# Patient Record
Sex: Male | Born: 1955
Health system: Southern US, Community
[De-identification: ages and names within clinical notes are randomized; demographics above are authoritative.]

---

## 2007-05-08 ENCOUNTER — Encounter
Admission: RE | Admit: 2007-05-08 | Discharge: 2007-05-08 | Payer: Self-pay | Admitting: Physical Medicine and Rehabilitation

## 2009-08-21 IMAGING — CT CT L SPINE W/O CM
4 of 8 series · 13 of 33 positions shown, 15 images · IV contrast (agent unspecified)
Comparison: none

CLINICAL DATA: Diskography, L3-4 through L5-S1.
 LUMBAR SPINE CT WITHOUT CONTRAST:
TECHNIQUE: Multidetector CT imaging of the lumbar spine was performed.  Multiplanar CT image reconstructions were also generated.

[Series 2: l-spine helical · axial · 0.27mm/px · z∈[-242,-158]mm · 3 of 69 slices shown]
[im 18/69  bone]
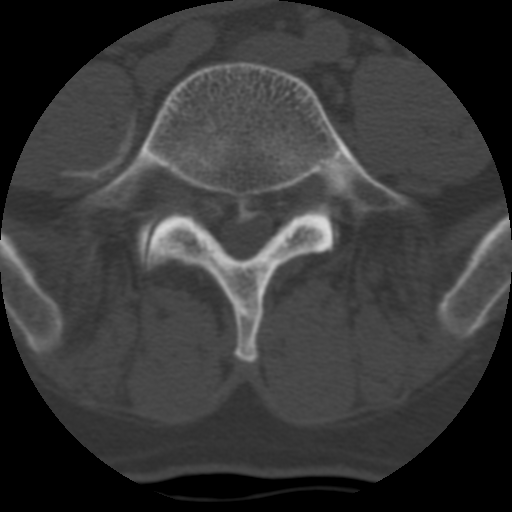
[im 35/69  bone]
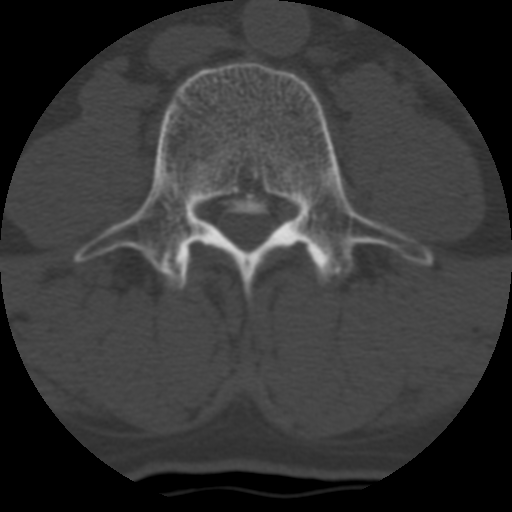
[im 52/69  bone]
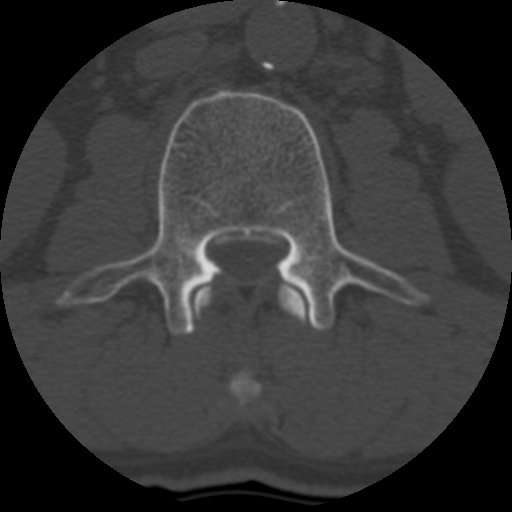

[Series 3: bone windows · axial · 0.27mm/px · z∈[-252,-150]mm · 4 of 69 slices shown, 5 images]
[im 14/69  soft-tissue]
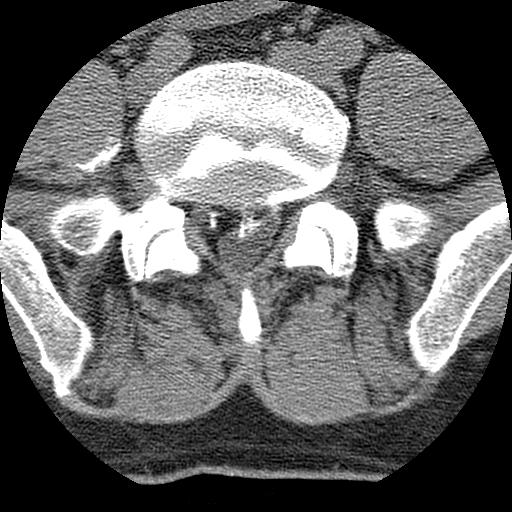
[im 14/69  bone]
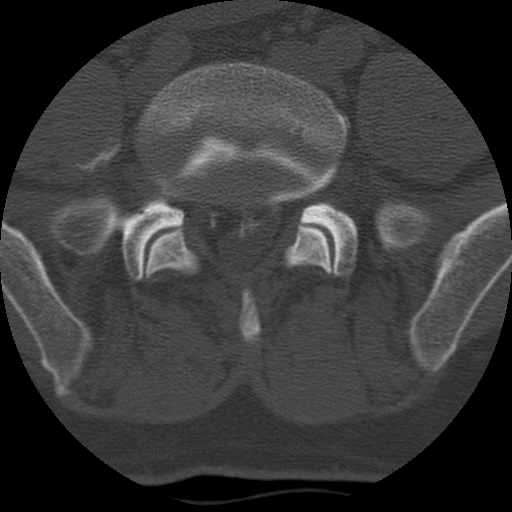
[im 28/69  bone]
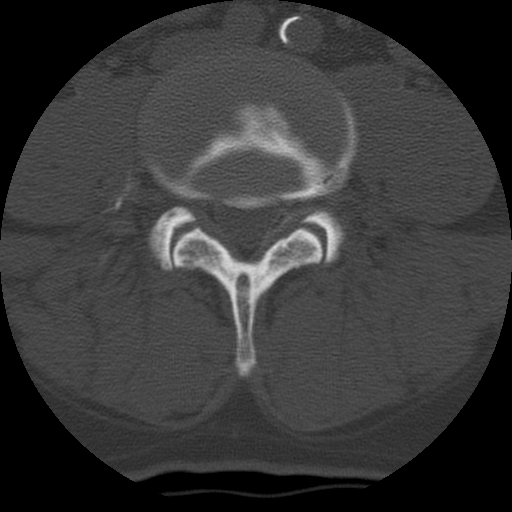
[im 41/69  bone]
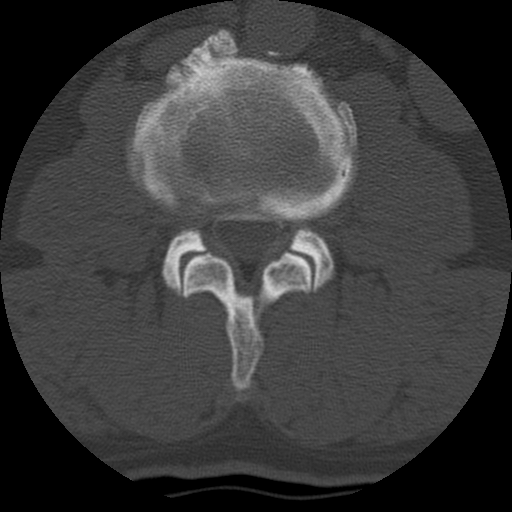
[im 55/69  bone]
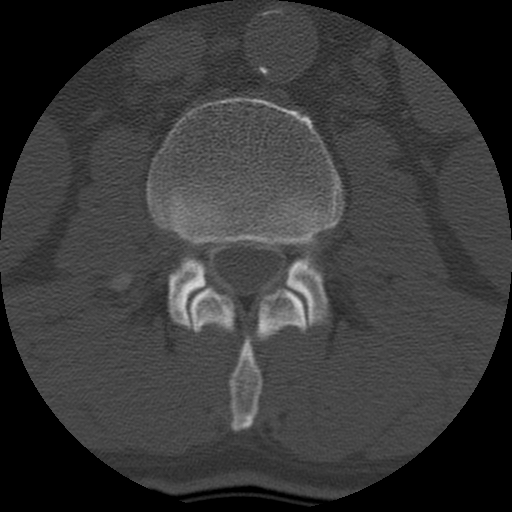

[Series 400: coronal l-spine · coronal · 0.34mm/px · 1 of 40 slices shown]
[im 20/40  bone]
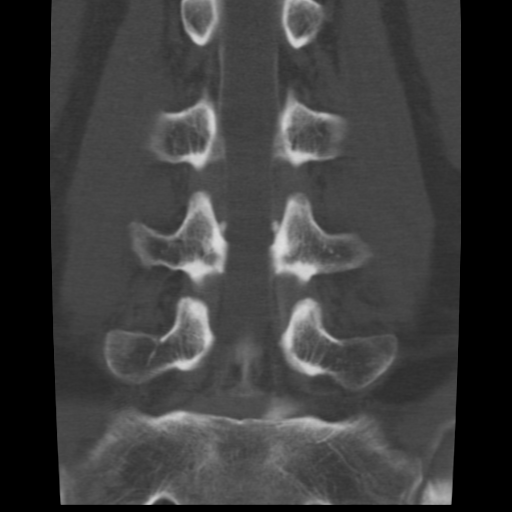

[Series 402: sagittal l-spine · sagittal · 0.34mm/px · 5 of 40 slices shown, 6 images]
[im 14/40  bone]
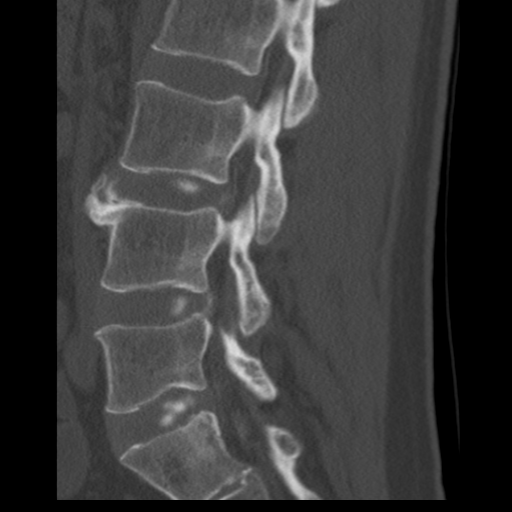
[im 17/40  bone]
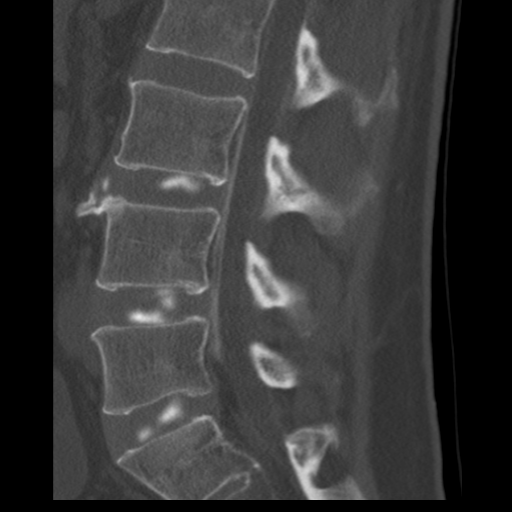
[im 20/40  soft-tissue]
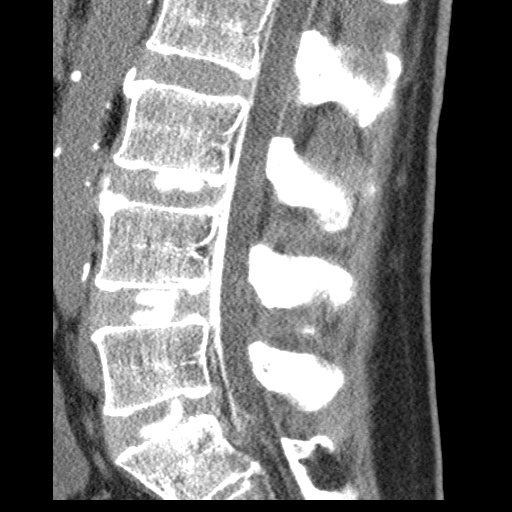
[im 20/40  bone]
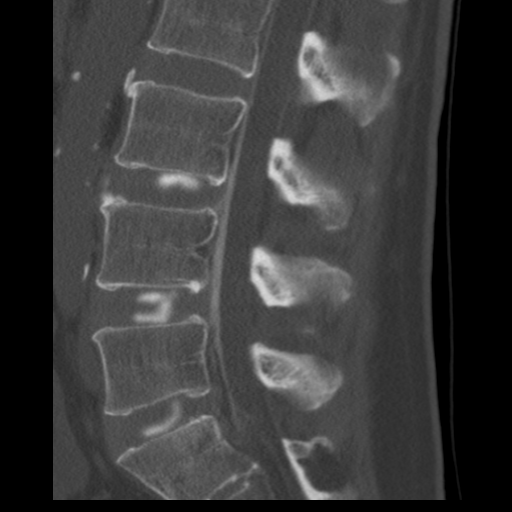
[im 23/40  bone]
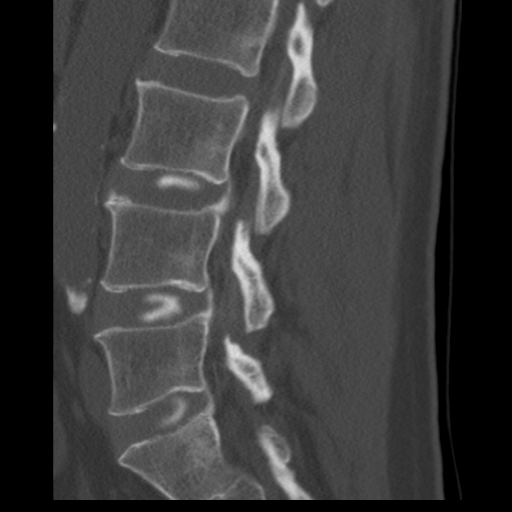
[im 27/40  bone]
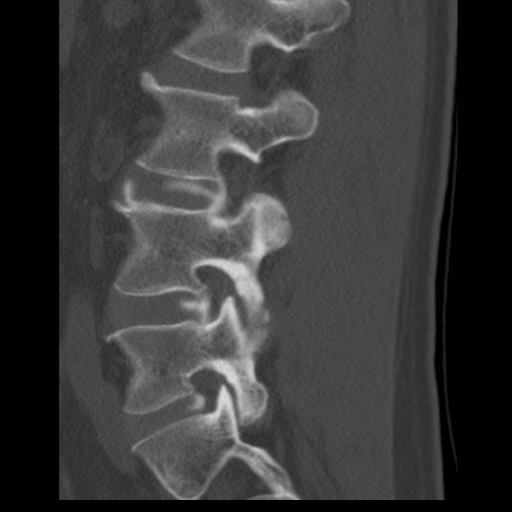

[13 of 33 positions shown; findings below may reference images not displayed]

FINDINGS: L3-4:  There is a normal central nuclear pattern.  There is an annular tear in the left posterolateral direction at 4 o'clock with contrast extending circumferentially within the annulus.  There is ventral epidural contrast diffusely but it is difficult to tell the exact spot that it is derived from.
 L4-5:  There is a normal central nuclear pattern.  There are bilateral posterolateral annular tears at both 4 o'clock and 8 o'clock.  Contrast extends around the annulus from 2 o'clock to 9 o'clock.  There is contrast in the ventral epidural space at this level.  
 L5-S1:  There is a normal central nuclear pattern.  There is bilateral posterolateral annular tearing at 4 o'clock and 8 o'clock.  There is ventral epidural contrast.  No compressive stenosis is seen.
 No focal osseous lesion or pronounced facet arthropathy.  There are anterior bridging osteophytes at L3-4.  There is atherosclerosis of the aorta but no aneurysm.  The L2-3 level was also covered by the scan though this was not studied at diskography.  The disk was unremarkable and the canal and foramina are widely patent.  Epidural contrast is present from the injections below.
IMPRESSION: 1.  L3-4:  Annular tearing at 4 o'clock.  Contrast dissects circumferentially around the annulus.  
 2.  L4-5:  Annular tearing at 4 o'clock and 8 o'clock.  Contrast extends around the annulus from 2 o'clock to 9 o'clock.
 3.  L5-S1:  Annular tearing at 4 o'clock and 8 o'clock.  
 4.   There is diffuse epidural contrast.  It is impossible to tell on this CT where this came from precisely.  I only have limited films from the diskogram procedure.

## 2016-02-02 DIAGNOSIS — R1903 Right lower quadrant abdominal swelling, mass and lump: Secondary | ICD-10-CM | POA: Insufficient documentation

## 2017-03-22 DIAGNOSIS — Z87898 Personal history of other specified conditions: Secondary | ICD-10-CM | POA: Diagnosis not present

## 2017-03-22 DIAGNOSIS — E782 Mixed hyperlipidemia: Secondary | ICD-10-CM | POA: Diagnosis not present

## 2017-03-22 DIAGNOSIS — E1165 Type 2 diabetes mellitus with hyperglycemia: Secondary | ICD-10-CM | POA: Diagnosis not present

## 2017-04-21 DIAGNOSIS — E1165 Type 2 diabetes mellitus with hyperglycemia: Secondary | ICD-10-CM | POA: Diagnosis not present

## 2017-04-21 DIAGNOSIS — Z23 Encounter for immunization: Secondary | ICD-10-CM | POA: Diagnosis not present

## 2017-08-18 DIAGNOSIS — E113512 Type 2 diabetes mellitus with proliferative diabetic retinopathy with macular edema, left eye: Secondary | ICD-10-CM | POA: Diagnosis not present

## 2017-08-24 DIAGNOSIS — E1165 Type 2 diabetes mellitus with hyperglycemia: Secondary | ICD-10-CM | POA: Diagnosis not present

## 2017-08-24 DIAGNOSIS — E782 Mixed hyperlipidemia: Secondary | ICD-10-CM | POA: Diagnosis not present

## 2017-09-01 DIAGNOSIS — E113411 Type 2 diabetes mellitus with severe nonproliferative diabetic retinopathy with macular edema, right eye: Secondary | ICD-10-CM | POA: Diagnosis not present

## 2017-09-01 DIAGNOSIS — E113512 Type 2 diabetes mellitus with proliferative diabetic retinopathy with macular edema, left eye: Secondary | ICD-10-CM | POA: Diagnosis not present

## 2017-09-07 DIAGNOSIS — E113512 Type 2 diabetes mellitus with proliferative diabetic retinopathy with macular edema, left eye: Secondary | ICD-10-CM | POA: Diagnosis not present

## 2017-10-13 DIAGNOSIS — E113512 Type 2 diabetes mellitus with proliferative diabetic retinopathy with macular edema, left eye: Secondary | ICD-10-CM | POA: Diagnosis not present

## 2017-10-13 DIAGNOSIS — E113411 Type 2 diabetes mellitus with severe nonproliferative diabetic retinopathy with macular edema, right eye: Secondary | ICD-10-CM | POA: Diagnosis not present

## 2017-11-17 DIAGNOSIS — E113512 Type 2 diabetes mellitus with proliferative diabetic retinopathy with macular edema, left eye: Secondary | ICD-10-CM | POA: Diagnosis not present

## 2017-11-17 DIAGNOSIS — E113411 Type 2 diabetes mellitus with severe nonproliferative diabetic retinopathy with macular edema, right eye: Secondary | ICD-10-CM | POA: Diagnosis not present

## 2017-12-06 DIAGNOSIS — E782 Mixed hyperlipidemia: Secondary | ICD-10-CM | POA: Diagnosis not present

## 2017-12-06 DIAGNOSIS — E1165 Type 2 diabetes mellitus with hyperglycemia: Secondary | ICD-10-CM | POA: Diagnosis not present

## 2017-12-06 DIAGNOSIS — Z1331 Encounter for screening for depression: Secondary | ICD-10-CM | POA: Diagnosis not present

## 2017-12-14 DIAGNOSIS — E875 Hyperkalemia: Secondary | ICD-10-CM | POA: Diagnosis not present

## 2017-12-29 DIAGNOSIS — E113513 Type 2 diabetes mellitus with proliferative diabetic retinopathy with macular edema, bilateral: Secondary | ICD-10-CM | POA: Diagnosis not present

## 2018-02-09 DIAGNOSIS — E113513 Type 2 diabetes mellitus with proliferative diabetic retinopathy with macular edema, bilateral: Secondary | ICD-10-CM | POA: Diagnosis not present

## 2018-03-30 DIAGNOSIS — E113512 Type 2 diabetes mellitus with proliferative diabetic retinopathy with macular edema, left eye: Secondary | ICD-10-CM | POA: Diagnosis not present

## 2018-03-30 DIAGNOSIS — E113411 Type 2 diabetes mellitus with severe nonproliferative diabetic retinopathy with macular edema, right eye: Secondary | ICD-10-CM | POA: Diagnosis not present

## 2018-08-01 DIAGNOSIS — E782 Mixed hyperlipidemia: Secondary | ICD-10-CM | POA: Diagnosis not present

## 2018-08-01 DIAGNOSIS — E1165 Type 2 diabetes mellitus with hyperglycemia: Secondary | ICD-10-CM | POA: Diagnosis not present

## 2018-08-01 DIAGNOSIS — Z2821 Immunization not carried out because of patient refusal: Secondary | ICD-10-CM | POA: Diagnosis not present

## 2018-08-10 DIAGNOSIS — E113411 Type 2 diabetes mellitus with severe nonproliferative diabetic retinopathy with macular edema, right eye: Secondary | ICD-10-CM | POA: Diagnosis not present

## 2018-08-10 DIAGNOSIS — E113512 Type 2 diabetes mellitus with proliferative diabetic retinopathy with macular edema, left eye: Secondary | ICD-10-CM | POA: Diagnosis not present

## 2018-08-24 DIAGNOSIS — E113513 Type 2 diabetes mellitus with proliferative diabetic retinopathy with macular edema, bilateral: Secondary | ICD-10-CM | POA: Diagnosis not present

## 2018-11-06 DIAGNOSIS — E1169 Type 2 diabetes mellitus with other specified complication: Secondary | ICD-10-CM | POA: Diagnosis not present

## 2018-11-06 DIAGNOSIS — E119 Type 2 diabetes mellitus without complications: Secondary | ICD-10-CM | POA: Diagnosis not present

## 2018-11-06 DIAGNOSIS — Z79899 Other long term (current) drug therapy: Secondary | ICD-10-CM | POA: Diagnosis not present

## 2018-11-14 DIAGNOSIS — E875 Hyperkalemia: Secondary | ICD-10-CM | POA: Diagnosis not present

## 2018-12-08 DIAGNOSIS — E113411 Type 2 diabetes mellitus with severe nonproliferative diabetic retinopathy with macular edema, right eye: Secondary | ICD-10-CM | POA: Diagnosis not present

## 2018-12-15 ENCOUNTER — Other Ambulatory Visit: Payer: Self-pay

## 2018-12-15 NOTE — Patient Outreach (Signed)
[facility] [facility]) Care Management  12/15/2018  Phillip Rosales [birth year] [id number]   Medication Adherence call to Mr. Phillip Rosales HIPPA Compliant Voice message left with a call back number. Phillip Rosales is showing past due on Rosuvastatin 10 mg under [place].   [place] Management Direct Dial [phone]  Fax [phone] [email or web address]

## 2019-01-08 ENCOUNTER — Other Ambulatory Visit: Payer: Self-pay

## 2019-01-08 NOTE — Patient Outreach (Signed)
[facility]) Care Management  01/08/2019  Phillip Rosales 16-Jun-1956 [id number]   Medication Adherence call to Phillip Rosales patient's telephone number is disconnected patient is past due under [place].   [place] Management Direct Dial [phone]  Fax [phone] [name]

## 2019-01-31 DIAGNOSIS — Z9181 History of falling: Secondary | ICD-10-CM | POA: Diagnosis not present

## 2019-01-31 DIAGNOSIS — Z0000 Encounter for general adult medical examination without abnormal findings: Secondary | ICD-10-CM | POA: Diagnosis not present

## 2019-01-31 DIAGNOSIS — E785 Hyperlipidemia, unspecified: Secondary | ICD-10-CM | POA: Diagnosis not present

## 2019-01-31 DIAGNOSIS — Z136 Encounter for screening for cardiovascular disorders: Secondary | ICD-10-CM | POA: Diagnosis not present

## 2019-03-14 DIAGNOSIS — E119 Type 2 diabetes mellitus without complications: Secondary | ICD-10-CM | POA: Diagnosis not present

## 2019-03-14 DIAGNOSIS — E785 Hyperlipidemia, unspecified: Secondary | ICD-10-CM | POA: Diagnosis not present

## 2019-03-14 DIAGNOSIS — E1169 Type 2 diabetes mellitus with other specified complication: Secondary | ICD-10-CM | POA: Diagnosis not present

## 2019-04-24 DIAGNOSIS — Z6824 Body mass index (BMI) 24.0-24.9, adult: Secondary | ICD-10-CM | POA: Diagnosis not present

## 2019-04-24 DIAGNOSIS — H539 Unspecified visual disturbance: Secondary | ICD-10-CM | POA: Diagnosis not present

## 2019-05-01 DIAGNOSIS — E113512 Type 2 diabetes mellitus with proliferative diabetic retinopathy with macular edema, left eye: Secondary | ICD-10-CM | POA: Diagnosis not present

## 2019-05-01 DIAGNOSIS — E113411 Type 2 diabetes mellitus with severe nonproliferative diabetic retinopathy with macular edema, right eye: Secondary | ICD-10-CM | POA: Diagnosis not present

## 2019-08-09 DIAGNOSIS — E1169 Type 2 diabetes mellitus with other specified complication: Secondary | ICD-10-CM | POA: Diagnosis not present

## 2019-08-09 DIAGNOSIS — Z6824 Body mass index (BMI) 24.0-24.9, adult: Secondary | ICD-10-CM | POA: Diagnosis not present

## 2019-08-09 DIAGNOSIS — E1165 Type 2 diabetes mellitus with hyperglycemia: Secondary | ICD-10-CM | POA: Diagnosis not present

## 2019-08-09 DIAGNOSIS — E785 Hyperlipidemia, unspecified: Secondary | ICD-10-CM | POA: Diagnosis not present

## 2019-08-09 DIAGNOSIS — E559 Vitamin D deficiency, unspecified: Secondary | ICD-10-CM | POA: Diagnosis not present

## 2019-12-13 DIAGNOSIS — E113411 Type 2 diabetes mellitus with severe nonproliferative diabetic retinopathy with macular edema, right eye: Secondary | ICD-10-CM | POA: Diagnosis not present

## 2019-12-14 DIAGNOSIS — E559 Vitamin D deficiency, unspecified: Secondary | ICD-10-CM | POA: Diagnosis not present

## 2019-12-14 DIAGNOSIS — E785 Hyperlipidemia, unspecified: Secondary | ICD-10-CM | POA: Diagnosis not present

## 2019-12-14 DIAGNOSIS — E1165 Type 2 diabetes mellitus with hyperglycemia: Secondary | ICD-10-CM | POA: Diagnosis not present

## 2019-12-14 DIAGNOSIS — E1169 Type 2 diabetes mellitus with other specified complication: Secondary | ICD-10-CM | POA: Diagnosis not present

## 2019-12-14 DIAGNOSIS — Z6824 Body mass index (BMI) 24.0-24.9, adult: Secondary | ICD-10-CM | POA: Diagnosis not present

## 2020-01-09 DIAGNOSIS — Z6824 Body mass index (BMI) 24.0-24.9, adult: Secondary | ICD-10-CM | POA: Diagnosis not present

## 2020-01-09 DIAGNOSIS — E1169 Type 2 diabetes mellitus with other specified complication: Secondary | ICD-10-CM | POA: Diagnosis not present

## 2020-01-09 DIAGNOSIS — E785 Hyperlipidemia, unspecified: Secondary | ICD-10-CM | POA: Diagnosis not present

## 2020-01-09 DIAGNOSIS — E875 Hyperkalemia: Secondary | ICD-10-CM | POA: Diagnosis not present

## 2020-01-09 DIAGNOSIS — E1165 Type 2 diabetes mellitus with hyperglycemia: Secondary | ICD-10-CM | POA: Diagnosis not present

## 2020-01-24 DIAGNOSIS — E113211 Type 2 diabetes mellitus with mild nonproliferative diabetic retinopathy with macular edema, right eye: Secondary | ICD-10-CM | POA: Diagnosis not present

## 2020-01-30 DIAGNOSIS — Z23 Encounter for immunization: Secondary | ICD-10-CM | POA: Diagnosis not present

## 2020-01-30 DIAGNOSIS — L97509 Non-pressure chronic ulcer of other part of unspecified foot with unspecified severity: Secondary | ICD-10-CM | POA: Diagnosis not present

## 2020-01-30 DIAGNOSIS — E11621 Type 2 diabetes mellitus with foot ulcer: Secondary | ICD-10-CM | POA: Diagnosis not present

## 2020-01-30 DIAGNOSIS — Z6823 Body mass index (BMI) 23.0-23.9, adult: Secondary | ICD-10-CM | POA: Diagnosis not present

## 2020-01-30 DIAGNOSIS — L0390 Cellulitis, unspecified: Secondary | ICD-10-CM | POA: Diagnosis not present

## 2020-01-31 ENCOUNTER — Encounter: Payer: Self-pay | Admitting: Sports Medicine

## 2020-01-31 ENCOUNTER — Ambulatory Visit: Payer: Medicare Other | Admitting: Sports Medicine

## 2020-01-31 ENCOUNTER — Other Ambulatory Visit: Payer: Self-pay

## 2020-01-31 ENCOUNTER — Ambulatory Visit (INDEPENDENT_AMBULATORY_CARE_PROVIDER_SITE_OTHER): Payer: Medicare Other

## 2020-01-31 DIAGNOSIS — L97511 Non-pressure chronic ulcer of other part of right foot limited to breakdown of skin: Secondary | ICD-10-CM

## 2020-01-31 DIAGNOSIS — Z823 Family history of stroke: Secondary | ICD-10-CM | POA: Diagnosis not present

## 2020-01-31 DIAGNOSIS — M19071 Primary osteoarthritis, right ankle and foot: Secondary | ICD-10-CM | POA: Diagnosis not present

## 2020-01-31 DIAGNOSIS — L02611 Cutaneous abscess of right foot: Secondary | ICD-10-CM | POA: Diagnosis not present

## 2020-01-31 DIAGNOSIS — Z20822 Contact with and (suspected) exposure to covid-19: Secondary | ICD-10-CM | POA: Diagnosis not present

## 2020-01-31 DIAGNOSIS — L03031 Cellulitis of right toe: Secondary | ICD-10-CM | POA: Diagnosis not present

## 2020-01-31 DIAGNOSIS — E1142 Type 2 diabetes mellitus with diabetic polyneuropathy: Secondary | ICD-10-CM | POA: Diagnosis not present

## 2020-01-31 DIAGNOSIS — L97516 Non-pressure chronic ulcer of other part of right foot with bone involvement without evidence of necrosis: Secondary | ICD-10-CM | POA: Diagnosis not present

## 2020-01-31 DIAGNOSIS — Z89421 Acquired absence of other right toe(s): Secondary | ICD-10-CM | POA: Diagnosis not present

## 2020-01-31 DIAGNOSIS — L97509 Non-pressure chronic ulcer of other part of unspecified foot with unspecified severity: Secondary | ICD-10-CM | POA: Diagnosis not present

## 2020-01-31 DIAGNOSIS — B0229 Other postherpetic nervous system involvement: Secondary | ICD-10-CM | POA: Diagnosis not present

## 2020-01-31 DIAGNOSIS — Z8249 Family history of ischemic heart disease and other diseases of the circulatory system: Secondary | ICD-10-CM | POA: Diagnosis not present

## 2020-01-31 DIAGNOSIS — Z9889 Other specified postprocedural states: Secondary | ICD-10-CM | POA: Diagnosis not present

## 2020-01-31 DIAGNOSIS — Z809 Family history of malignant neoplasm, unspecified: Secondary | ICD-10-CM | POA: Diagnosis not present

## 2020-01-31 DIAGNOSIS — A4901 Methicillin susceptible Staphylococcus aureus infection, unspecified site: Secondary | ICD-10-CM | POA: Diagnosis not present

## 2020-01-31 DIAGNOSIS — E11621 Type 2 diabetes mellitus with foot ulcer: Secondary | ICD-10-CM | POA: Diagnosis not present

## 2020-01-31 DIAGNOSIS — I70293 Other atherosclerosis of native arteries of extremities, bilateral legs: Secondary | ICD-10-CM | POA: Diagnosis not present

## 2020-01-31 DIAGNOSIS — Z87891 Personal history of nicotine dependence: Secondary | ICD-10-CM | POA: Diagnosis not present

## 2020-01-31 DIAGNOSIS — E119 Type 2 diabetes mellitus without complications: Secondary | ICD-10-CM | POA: Diagnosis not present

## 2020-01-31 DIAGNOSIS — E1169 Type 2 diabetes mellitus with other specified complication: Secondary | ICD-10-CM | POA: Diagnosis not present

## 2020-01-31 DIAGNOSIS — L089 Local infection of the skin and subcutaneous tissue, unspecified: Secondary | ICD-10-CM | POA: Diagnosis not present

## 2020-01-31 DIAGNOSIS — M86171 Other acute osteomyelitis, right ankle and foot: Secondary | ICD-10-CM | POA: Diagnosis not present

## 2020-01-31 DIAGNOSIS — M7989 Other specified soft tissue disorders: Secondary | ICD-10-CM | POA: Diagnosis not present

## 2020-01-31 DIAGNOSIS — E11319 Type 2 diabetes mellitus with unspecified diabetic retinopathy without macular edema: Secondary | ICD-10-CM | POA: Diagnosis not present

## 2020-01-31 DIAGNOSIS — Z825 Family history of asthma and other chronic lower respiratory diseases: Secondary | ICD-10-CM | POA: Diagnosis not present

## 2020-01-31 DIAGNOSIS — E785 Hyperlipidemia, unspecified: Secondary | ICD-10-CM | POA: Diagnosis not present

## 2020-01-31 DIAGNOSIS — E7800 Pure hypercholesterolemia, unspecified: Secondary | ICD-10-CM | POA: Diagnosis not present

## 2020-01-31 DIAGNOSIS — S98131A Complete traumatic amputation of one right lesser toe, initial encounter: Secondary | ICD-10-CM | POA: Diagnosis not present

## 2020-01-31 NOTE — Progress Notes (Signed)
Subjective: Phillip Rosales is a 64 y.o. male patient seen in office for evaluation of ulceration of the right fourth toe. Patient has a history of diabetes and a blood glucose level 82 and last A1c 6.2 was last seen by PCP Y[facility] 3 to 4 weeks ago and was put on antibiotics and also gave a shot of antibiotics and has a MRI arranged for 4 PM today.  Patient reports that the wound on the right foot was bad but slowly it seems like it is getting better he thinks that it started after pedaling an exercise bike and the toe got rubbed.  Reports that there is no drainage but some white skin in between the toes denies nausea vomiting fever chills or any other constitutional symptoms at this time.  Review of systems noncontributory.  Patient Active Problem List   Diagnosis Date Noted   RLQ abdominal mass 02/02/2016   Current Outpatient Medications on File Prior to Visit  Medication Sig Dispense Refill   glucose blood (ONETOUCH VERIO) test strip      clindamycin (CLEOCIN) 300 MG capsule Take 300 mg by mouth every 6 (six) hours.     glyBURIDE (DIABETA) 5 MG tablet Take by mouth.     metFORMIN (GLUCOPHAGE) 500 MG tablet Take 500 mg by mouth 2 (two) times daily.     rosuvastatin (CRESTOR) 40 MG tablet Take 40 mg by mouth every morning.     No current facility-administered medications on file prior to visit.   Not on File  No results found for this or any previous visit (from the past 2160 hour(s)).  Objective: There were no vitals filed for this visit.  General: Patient is awake, alert, oriented x 3 and in no acute distress.  Dermatology: Skin is warm and dry bilateral with a partial thickness ulceration present  Lateral right fourth toe ulceration measures 0.5 cm x 0.5 cm x 0.2 cm. There is a  Macerated border with a macerated base. The ulceration is close to bone with significant clear drainage and cellulitis to the level of the midfoot and edema with minimal warmth.   Vascular: Dorsalis  Pedis pulse = 1/4 Bilateral,  Posterior Tibial pulse = 1/4 Bilateral,  Capillary Fill Time < 5 seconds  Neurologic: Protective sensation diminished bilateral.  Musculosketal: There is no pain to palpation to ulcerated area, mild hammertoe contracture.  Xrays, right foot- No bony destruction suggestive of osteomyelitis. No gas in soft tissues.   No results for input(s): GRAMSTAIN, LABORGA in the last 8760 hours.  Assessment and Plan:  Problem List Items Addressed This Visit    None    Visit Diagnoses    Ulcer of right foot, limited to breakdown of skin ([place])    -  Primary   Relevant Orders   DG Foot Complete Right   Cellulitis and abscess of toe of right foot       Diabetic polyneuropathy associated with type 2 diabetes mellitus (HCC)       Relevant Medications   glyBURIDE (DIABETA) 5 MG tablet   metFORMIN (GLUCOPHAGE) 500 MG tablet   rosuvastatin (CRESTOR) 40 MG tablet       -Examined patient and discussed the progression of the wound and treatment alternatives. -Xrays reviewed -Cleanse ulceration and apply Betadine advised patient to do the same daily and may leave open to air -Dispensed postoperative shoe and advised patient to wear this shoe daily to prevent rubbing of toe -Patient to go to for MRI today at 4 PM  for further evaluation rule out osteomyelitis or abscess -Continue with oral antibiotics as given by PCP - Advised patient to go to the ER or return to office if the wound worsens or if constitutional symptoms are present. -Patient to return to office in 1 week for follow up care and evaluation or sooner if problems arise.  Landis Martins, DPM

## 2020-02-01 ENCOUNTER — Telehealth: Payer: Self-pay

## 2020-02-01 DIAGNOSIS — M7989 Other specified soft tissue disorders: Secondary | ICD-10-CM | POA: Diagnosis not present

## 2020-02-01 DIAGNOSIS — E11621 Type 2 diabetes mellitus with foot ulcer: Secondary | ICD-10-CM | POA: Diagnosis not present

## 2020-02-01 DIAGNOSIS — I70293 Other atherosclerosis of native arteries of extremities, bilateral legs: Secondary | ICD-10-CM | POA: Diagnosis not present

## 2020-02-01 DIAGNOSIS — M86171 Other acute osteomyelitis, right ankle and foot: Secondary | ICD-10-CM | POA: Diagnosis not present

## 2020-02-01 DIAGNOSIS — M19071 Primary osteoarthritis, right ankle and foot: Secondary | ICD-10-CM | POA: Diagnosis not present

## 2020-02-01 DIAGNOSIS — L97516 Non-pressure chronic ulcer of other part of right foot with bone involvement without evidence of necrosis: Secondary | ICD-10-CM | POA: Diagnosis not present

## 2020-02-01 NOTE — Telephone Encounter (Signed)
Contacted Pt and advised him of Dr. Leeanne Rio wound results and suggestions sent 01/31/20 at 10:23PM.  Pt stated Dr. Lorin Mercy had called him last night as well and suggested for him to go to ER early in the morning, pt states he is already at the ER not knowing what Dr. Lorin Mercy plans on doing or performing.

## 2020-02-01 NOTE — Telephone Encounter (Signed)
-----   Message from Landis Martins, [place] sent at 01/31/2020 10:23 PM EDT ----- Regarding: MRI Results Please let patient know that his MRI from yesterday shows that he has some early bone infection in the right fourth toe.  At this time I recommend for him to continue with taking his antibiotics and continue with the Betadine medication in between the toes.  When patient comes to the office on next week we can further discuss treatment options which may include continuing with antibiotics versus surgery. Thanks Dr. Cannon Kettle

## 2020-02-02 DIAGNOSIS — M86171 Other acute osteomyelitis, right ankle and foot: Secondary | ICD-10-CM | POA: Diagnosis not present

## 2020-02-08 ENCOUNTER — Ambulatory Visit: Payer: Medicare Other | Admitting: Sports Medicine

## 2020-02-12 DIAGNOSIS — M86171 Other acute osteomyelitis, right ankle and foot: Secondary | ICD-10-CM | POA: Diagnosis not present

## 2020-02-12 DIAGNOSIS — Z89421 Acquired absence of other right toe(s): Secondary | ICD-10-CM | POA: Diagnosis not present

## 2020-02-19 DIAGNOSIS — Z89421 Acquired absence of other right toe(s): Secondary | ICD-10-CM | POA: Diagnosis not present

## 2020-02-19 DIAGNOSIS — M86171 Other acute osteomyelitis, right ankle and foot: Secondary | ICD-10-CM | POA: Diagnosis not present

## 2020-03-04 DIAGNOSIS — Z89421 Acquired absence of other right toe(s): Secondary | ICD-10-CM | POA: Diagnosis not present

## 2020-03-06 DIAGNOSIS — E113211 Type 2 diabetes mellitus with mild nonproliferative diabetic retinopathy with macular edema, right eye: Secondary | ICD-10-CM | POA: Diagnosis not present

## 2020-03-21 DIAGNOSIS — Z01818 Encounter for other preprocedural examination: Secondary | ICD-10-CM | POA: Diagnosis not present

## 2020-03-21 DIAGNOSIS — E119 Type 2 diabetes mellitus without complications: Secondary | ICD-10-CM | POA: Diagnosis not present

## 2020-03-21 DIAGNOSIS — H2512 Age-related nuclear cataract, left eye: Secondary | ICD-10-CM | POA: Diagnosis not present

## 2020-03-21 DIAGNOSIS — E113512 Type 2 diabetes mellitus with proliferative diabetic retinopathy with macular edema, left eye: Secondary | ICD-10-CM | POA: Diagnosis not present

## 2020-04-15 DIAGNOSIS — H2512 Age-related nuclear cataract, left eye: Secondary | ICD-10-CM | POA: Diagnosis not present

## 2020-04-15 DIAGNOSIS — Z7984 Long term (current) use of oral hypoglycemic drugs: Secondary | ICD-10-CM | POA: Diagnosis not present

## 2020-04-15 DIAGNOSIS — E11311 Type 2 diabetes mellitus with unspecified diabetic retinopathy with macular edema: Secondary | ICD-10-CM | POA: Diagnosis not present

## 2020-04-15 DIAGNOSIS — E7800 Pure hypercholesterolemia, unspecified: Secondary | ICD-10-CM | POA: Diagnosis not present

## 2020-04-15 DIAGNOSIS — H259 Unspecified age-related cataract: Secondary | ICD-10-CM | POA: Diagnosis not present

## 2020-04-15 DIAGNOSIS — E1136 Type 2 diabetes mellitus with diabetic cataract: Secondary | ICD-10-CM | POA: Diagnosis not present

## 2020-05-05 DIAGNOSIS — E1169 Type 2 diabetes mellitus with other specified complication: Secondary | ICD-10-CM | POA: Diagnosis not present

## 2020-05-05 DIAGNOSIS — R030 Elevated blood-pressure reading, without diagnosis of hypertension: Secondary | ICD-10-CM | POA: Diagnosis not present

## 2020-05-05 DIAGNOSIS — E785 Hyperlipidemia, unspecified: Secondary | ICD-10-CM | POA: Diagnosis not present

## 2020-05-05 DIAGNOSIS — E1149 Type 2 diabetes mellitus with other diabetic neurological complication: Secondary | ICD-10-CM | POA: Diagnosis not present

## 2020-07-10 DIAGNOSIS — E113211 Type 2 diabetes mellitus with mild nonproliferative diabetic retinopathy with macular edema, right eye: Secondary | ICD-10-CM | POA: Diagnosis not present

## 2020-07-10 DIAGNOSIS — E113512 Type 2 diabetes mellitus with proliferative diabetic retinopathy with macular edema, left eye: Secondary | ICD-10-CM | POA: Diagnosis not present

## 2020-08-07 DIAGNOSIS — E785 Hyperlipidemia, unspecified: Secondary | ICD-10-CM | POA: Diagnosis not present

## 2020-08-07 DIAGNOSIS — Z6824 Body mass index (BMI) 24.0-24.9, adult: Secondary | ICD-10-CM | POA: Diagnosis not present

## 2020-08-07 DIAGNOSIS — S98131A Complete traumatic amputation of one right lesser toe, initial encounter: Secondary | ICD-10-CM | POA: Diagnosis not present

## 2020-08-07 DIAGNOSIS — Z1211 Encounter for screening for malignant neoplasm of colon: Secondary | ICD-10-CM | POA: Diagnosis not present

## 2020-08-07 DIAGNOSIS — Z20822 Contact with and (suspected) exposure to covid-19: Secondary | ICD-10-CM | POA: Diagnosis not present

## 2020-08-07 DIAGNOSIS — E1149 Type 2 diabetes mellitus with other diabetic neurological complication: Secondary | ICD-10-CM | POA: Diagnosis not present

## 2020-08-07 DIAGNOSIS — Z8739 Personal history of other diseases of the musculoskeletal system and connective tissue: Secondary | ICD-10-CM | POA: Diagnosis not present

## 2020-08-07 DIAGNOSIS — E1169 Type 2 diabetes mellitus with other specified complication: Secondary | ICD-10-CM | POA: Diagnosis not present

## 2020-08-07 DIAGNOSIS — E559 Vitamin D deficiency, unspecified: Secondary | ICD-10-CM | POA: Diagnosis not present

## 2020-08-07 DIAGNOSIS — R030 Elevated blood-pressure reading, without diagnosis of hypertension: Secondary | ICD-10-CM | POA: Diagnosis not present

## 2020-08-19 DIAGNOSIS — Z9181 History of falling: Secondary | ICD-10-CM | POA: Diagnosis not present

## 2020-08-19 DIAGNOSIS — Z0000 Encounter for general adult medical examination without abnormal findings: Secondary | ICD-10-CM | POA: Diagnosis not present

## 2020-08-19 DIAGNOSIS — E785 Hyperlipidemia, unspecified: Secondary | ICD-10-CM | POA: Diagnosis not present

## 2020-08-21 DIAGNOSIS — E875 Hyperkalemia: Secondary | ICD-10-CM | POA: Diagnosis not present

## 2020-09-02 DIAGNOSIS — Z89421 Acquired absence of other right toe(s): Secondary | ICD-10-CM | POA: Diagnosis not present

## 2020-09-02 DIAGNOSIS — E119 Type 2 diabetes mellitus without complications: Secondary | ICD-10-CM | POA: Diagnosis not present

## 2020-10-09 DIAGNOSIS — E113213 Type 2 diabetes mellitus with mild nonproliferative diabetic retinopathy with macular edema, bilateral: Secondary | ICD-10-CM | POA: Diagnosis not present

## 2020-12-18 DIAGNOSIS — Z1211 Encounter for screening for malignant neoplasm of colon: Secondary | ICD-10-CM | POA: Diagnosis not present

## 2020-12-18 DIAGNOSIS — E785 Hyperlipidemia, unspecified: Secondary | ICD-10-CM | POA: Diagnosis not present

## 2020-12-18 DIAGNOSIS — E1169 Type 2 diabetes mellitus with other specified complication: Secondary | ICD-10-CM | POA: Diagnosis not present

## 2020-12-18 DIAGNOSIS — Z8739 Personal history of other diseases of the musculoskeletal system and connective tissue: Secondary | ICD-10-CM | POA: Diagnosis not present

## 2020-12-18 DIAGNOSIS — E1149 Type 2 diabetes mellitus with other diabetic neurological complication: Secondary | ICD-10-CM | POA: Diagnosis not present

## 2020-12-18 DIAGNOSIS — E559 Vitamin D deficiency, unspecified: Secondary | ICD-10-CM | POA: Diagnosis not present

## 2020-12-18 DIAGNOSIS — S98131A Complete traumatic amputation of one right lesser toe, initial encounter: Secondary | ICD-10-CM | POA: Diagnosis not present

## 2021-03-05 DIAGNOSIS — Z89421 Acquired absence of other right toe(s): Secondary | ICD-10-CM | POA: Diagnosis not present

## 2021-03-05 DIAGNOSIS — E119 Type 2 diabetes mellitus without complications: Secondary | ICD-10-CM | POA: Diagnosis not present

## 2021-04-16 DIAGNOSIS — L821 Other seborrheic keratosis: Secondary | ICD-10-CM | POA: Diagnosis not present

## 2021-04-16 DIAGNOSIS — D225 Melanocytic nevi of trunk: Secondary | ICD-10-CM | POA: Diagnosis not present

## 2021-04-16 DIAGNOSIS — L578 Other skin changes due to chronic exposure to nonionizing radiation: Secondary | ICD-10-CM | POA: Diagnosis not present

## 2021-04-16 DIAGNOSIS — C44319 Basal cell carcinoma of skin of other parts of face: Secondary | ICD-10-CM | POA: Diagnosis not present

## 2021-04-23 DIAGNOSIS — E559 Vitamin D deficiency, unspecified: Secondary | ICD-10-CM | POA: Diagnosis not present

## 2021-04-23 DIAGNOSIS — E785 Hyperlipidemia, unspecified: Secondary | ICD-10-CM | POA: Diagnosis not present

## 2021-04-23 DIAGNOSIS — E1169 Type 2 diabetes mellitus with other specified complication: Secondary | ICD-10-CM | POA: Diagnosis not present

## 2021-04-23 DIAGNOSIS — E1149 Type 2 diabetes mellitus with other diabetic neurological complication: Secondary | ICD-10-CM | POA: Diagnosis not present

## 2021-04-23 DIAGNOSIS — S98131A Complete traumatic amputation of one right lesser toe, initial encounter: Secondary | ICD-10-CM | POA: Diagnosis not present

## 2021-04-23 DIAGNOSIS — R030 Elevated blood-pressure reading, without diagnosis of hypertension: Secondary | ICD-10-CM | POA: Diagnosis not present

## 2021-04-23 DIAGNOSIS — Z8739 Personal history of other diseases of the musculoskeletal system and connective tissue: Secondary | ICD-10-CM | POA: Diagnosis not present

## 2021-08-26 DIAGNOSIS — R030 Elevated blood-pressure reading, without diagnosis of hypertension: Secondary | ICD-10-CM | POA: Diagnosis not present

## 2021-08-26 DIAGNOSIS — Z9181 History of falling: Secondary | ICD-10-CM | POA: Diagnosis not present

## 2021-08-26 DIAGNOSIS — E559 Vitamin D deficiency, unspecified: Secondary | ICD-10-CM | POA: Diagnosis not present

## 2021-08-26 DIAGNOSIS — S98131A Complete traumatic amputation of one right lesser toe, initial encounter: Secondary | ICD-10-CM | POA: Diagnosis not present

## 2021-08-26 DIAGNOSIS — E1149 Type 2 diabetes mellitus with other diabetic neurological complication: Secondary | ICD-10-CM | POA: Diagnosis not present

## 2021-08-26 DIAGNOSIS — Z8739 Personal history of other diseases of the musculoskeletal system and connective tissue: Secondary | ICD-10-CM | POA: Diagnosis not present

## 2021-08-26 DIAGNOSIS — E1169 Type 2 diabetes mellitus with other specified complication: Secondary | ICD-10-CM | POA: Diagnosis not present

## 2021-08-26 DIAGNOSIS — E785 Hyperlipidemia, unspecified: Secondary | ICD-10-CM | POA: Diagnosis not present

## 2021-09-04 DIAGNOSIS — E785 Hyperlipidemia, unspecified: Secondary | ICD-10-CM | POA: Diagnosis not present

## 2021-09-04 DIAGNOSIS — Z0000 Encounter for general adult medical examination without abnormal findings: Secondary | ICD-10-CM | POA: Diagnosis not present

## 2021-09-04 DIAGNOSIS — Z9181 History of falling: Secondary | ICD-10-CM | POA: Diagnosis not present

## 2021-09-10 DIAGNOSIS — E113211 Type 2 diabetes mellitus with mild nonproliferative diabetic retinopathy with macular edema, right eye: Secondary | ICD-10-CM | POA: Diagnosis not present

## 2021-10-22 DIAGNOSIS — E113211 Type 2 diabetes mellitus with mild nonproliferative diabetic retinopathy with macular edema, right eye: Secondary | ICD-10-CM | POA: Diagnosis not present

## 2021-11-24 DIAGNOSIS — Z7984 Long term (current) use of oral hypoglycemic drugs: Secondary | ICD-10-CM | POA: Diagnosis not present

## 2021-11-24 DIAGNOSIS — L84 Corns and callosities: Secondary | ICD-10-CM | POA: Diagnosis not present

## 2021-11-24 DIAGNOSIS — E119 Type 2 diabetes mellitus without complications: Secondary | ICD-10-CM | POA: Diagnosis not present

## 2021-11-25 DIAGNOSIS — E785 Hyperlipidemia, unspecified: Secondary | ICD-10-CM | POA: Diagnosis not present

## 2021-11-25 DIAGNOSIS — Z8739 Personal history of other diseases of the musculoskeletal system and connective tissue: Secondary | ICD-10-CM | POA: Diagnosis not present

## 2021-11-25 DIAGNOSIS — E1149 Type 2 diabetes mellitus with other diabetic neurological complication: Secondary | ICD-10-CM | POA: Diagnosis not present

## 2021-11-25 DIAGNOSIS — E11319 Type 2 diabetes mellitus with unspecified diabetic retinopathy without macular edema: Secondary | ICD-10-CM | POA: Diagnosis not present

## 2021-11-25 DIAGNOSIS — E7800 Pure hypercholesterolemia, unspecified: Secondary | ICD-10-CM | POA: Diagnosis not present

## 2021-11-25 DIAGNOSIS — E1169 Type 2 diabetes mellitus with other specified complication: Secondary | ICD-10-CM | POA: Diagnosis not present

## 2021-11-25 DIAGNOSIS — Z6822 Body mass index (BMI) 22.0-22.9, adult: Secondary | ICD-10-CM | POA: Diagnosis not present

## 2021-12-03 DIAGNOSIS — E113211 Type 2 diabetes mellitus with mild nonproliferative diabetic retinopathy with macular edema, right eye: Secondary | ICD-10-CM | POA: Diagnosis not present

## 2022-01-14 DIAGNOSIS — E113211 Type 2 diabetes mellitus with mild nonproliferative diabetic retinopathy with macular edema, right eye: Secondary | ICD-10-CM | POA: Diagnosis not present

## 2022-02-25 DIAGNOSIS — H25811 Combined forms of age-related cataract, right eye: Secondary | ICD-10-CM | POA: Diagnosis not present

## 2022-02-25 DIAGNOSIS — E113512 Type 2 diabetes mellitus with proliferative diabetic retinopathy with macular edema, left eye: Secondary | ICD-10-CM | POA: Diagnosis not present

## 2022-02-25 DIAGNOSIS — E113211 Type 2 diabetes mellitus with mild nonproliferative diabetic retinopathy with macular edema, right eye: Secondary | ICD-10-CM | POA: Diagnosis not present

## 2022-02-26 DIAGNOSIS — E785 Hyperlipidemia, unspecified: Secondary | ICD-10-CM | POA: Diagnosis not present

## 2022-02-26 DIAGNOSIS — E1149 Type 2 diabetes mellitus with other diabetic neurological complication: Secondary | ICD-10-CM | POA: Diagnosis not present

## 2022-02-26 DIAGNOSIS — Z6822 Body mass index (BMI) 22.0-22.9, adult: Secondary | ICD-10-CM | POA: Diagnosis not present

## 2022-02-26 DIAGNOSIS — E11319 Type 2 diabetes mellitus with unspecified diabetic retinopathy without macular edema: Secondary | ICD-10-CM | POA: Diagnosis not present

## 2022-02-26 DIAGNOSIS — E1169 Type 2 diabetes mellitus with other specified complication: Secondary | ICD-10-CM | POA: Diagnosis not present

## 2022-04-29 DIAGNOSIS — E113211 Type 2 diabetes mellitus with mild nonproliferative diabetic retinopathy with macular edema, right eye: Secondary | ICD-10-CM | POA: Diagnosis not present

## 2022-05-31 DIAGNOSIS — Z6823 Body mass index (BMI) 23.0-23.9, adult: Secondary | ICD-10-CM | POA: Diagnosis not present

## 2022-05-31 DIAGNOSIS — E1169 Type 2 diabetes mellitus with other specified complication: Secondary | ICD-10-CM | POA: Diagnosis not present

## 2022-05-31 DIAGNOSIS — L989 Disorder of the skin and subcutaneous tissue, unspecified: Secondary | ICD-10-CM | POA: Diagnosis not present

## 2022-05-31 DIAGNOSIS — E1149 Type 2 diabetes mellitus with other diabetic neurological complication: Secondary | ICD-10-CM | POA: Diagnosis not present

## 2022-05-31 DIAGNOSIS — E785 Hyperlipidemia, unspecified: Secondary | ICD-10-CM | POA: Diagnosis not present

## 2022-05-31 DIAGNOSIS — E11319 Type 2 diabetes mellitus with unspecified diabetic retinopathy without macular edema: Secondary | ICD-10-CM | POA: Diagnosis not present

## 2022-07-08 DIAGNOSIS — H25811 Combined forms of age-related cataract, right eye: Secondary | ICD-10-CM | POA: Diagnosis not present

## 2022-07-08 DIAGNOSIS — E113512 Type 2 diabetes mellitus with proliferative diabetic retinopathy with macular edema, left eye: Secondary | ICD-10-CM | POA: Diagnosis not present

## 2022-07-08 DIAGNOSIS — E113211 Type 2 diabetes mellitus with mild nonproliferative diabetic retinopathy with macular edema, right eye: Secondary | ICD-10-CM | POA: Diagnosis not present

## 2022-08-30 DIAGNOSIS — E1149 Type 2 diabetes mellitus with other diabetic neurological complication: Secondary | ICD-10-CM | POA: Diagnosis not present

## 2022-08-30 DIAGNOSIS — E785 Hyperlipidemia, unspecified: Secondary | ICD-10-CM | POA: Diagnosis not present

## 2022-08-30 DIAGNOSIS — Z6823 Body mass index (BMI) 23.0-23.9, adult: Secondary | ICD-10-CM | POA: Diagnosis not present

## 2022-08-30 DIAGNOSIS — E1169 Type 2 diabetes mellitus with other specified complication: Secondary | ICD-10-CM | POA: Diagnosis not present

## 2022-09-16 DIAGNOSIS — E113211 Type 2 diabetes mellitus with mild nonproliferative diabetic retinopathy with macular edema, right eye: Secondary | ICD-10-CM | POA: Diagnosis not present

## 2022-10-07 DIAGNOSIS — E11621 Type 2 diabetes mellitus with foot ulcer: Secondary | ICD-10-CM | POA: Diagnosis not present

## 2022-10-07 DIAGNOSIS — E1140 Type 2 diabetes mellitus with diabetic neuropathy, unspecified: Secondary | ICD-10-CM | POA: Diagnosis not present

## 2022-10-07 DIAGNOSIS — Z89421 Acquired absence of other right toe(s): Secondary | ICD-10-CM | POA: Diagnosis not present

## 2022-10-07 DIAGNOSIS — L97511 Non-pressure chronic ulcer of other part of right foot limited to breakdown of skin: Secondary | ICD-10-CM | POA: Diagnosis not present

## 2022-10-07 DIAGNOSIS — Z7984 Long term (current) use of oral hypoglycemic drugs: Secondary | ICD-10-CM | POA: Diagnosis not present

## 2022-10-21 DIAGNOSIS — E1149 Type 2 diabetes mellitus with other diabetic neurological complication: Secondary | ICD-10-CM | POA: Diagnosis not present

## 2022-10-21 DIAGNOSIS — Z6823 Body mass index (BMI) 23.0-23.9, adult: Secondary | ICD-10-CM | POA: Diagnosis not present

## 2022-11-25 DIAGNOSIS — E113211 Type 2 diabetes mellitus with mild nonproliferative diabetic retinopathy with macular edema, right eye: Secondary | ICD-10-CM | POA: Diagnosis not present

## 2022-11-30 DIAGNOSIS — E1149 Type 2 diabetes mellitus with other diabetic neurological complication: Secondary | ICD-10-CM | POA: Diagnosis not present

## 2022-11-30 DIAGNOSIS — Z9181 History of falling: Secondary | ICD-10-CM | POA: Diagnosis not present

## 2022-11-30 DIAGNOSIS — E1169 Type 2 diabetes mellitus with other specified complication: Secondary | ICD-10-CM | POA: Diagnosis not present

## 2022-11-30 DIAGNOSIS — Z6823 Body mass index (BMI) 23.0-23.9, adult: Secondary | ICD-10-CM | POA: Diagnosis not present

## 2022-12-02 DIAGNOSIS — Z89421 Acquired absence of other right toe(s): Secondary | ICD-10-CM | POA: Diagnosis not present

## 2022-12-02 DIAGNOSIS — L97511 Non-pressure chronic ulcer of other part of right foot limited to breakdown of skin: Secondary | ICD-10-CM | POA: Diagnosis not present

## 2022-12-02 DIAGNOSIS — E1140 Type 2 diabetes mellitus with diabetic neuropathy, unspecified: Secondary | ICD-10-CM | POA: Diagnosis not present

## 2022-12-16 DIAGNOSIS — E1142 Type 2 diabetes mellitus with diabetic polyneuropathy: Secondary | ICD-10-CM | POA: Diagnosis not present

## 2023-01-20 DIAGNOSIS — E119 Type 2 diabetes mellitus without complications: Secondary | ICD-10-CM | POA: Diagnosis not present

## 2023-01-20 DIAGNOSIS — L851 Acquired keratosis [keratoderma] palmaris et plantaris: Secondary | ICD-10-CM | POA: Diagnosis not present

## 2023-02-24 DIAGNOSIS — E113211 Type 2 diabetes mellitus with mild nonproliferative diabetic retinopathy with macular edema, right eye: Secondary | ICD-10-CM | POA: Diagnosis not present

## 2023-03-03 DIAGNOSIS — Z6823 Body mass index (BMI) 23.0-23.9, adult: Secondary | ICD-10-CM | POA: Diagnosis not present

## 2023-03-03 DIAGNOSIS — E11319 Type 2 diabetes mellitus with unspecified diabetic retinopathy without macular edema: Secondary | ICD-10-CM | POA: Diagnosis not present

## 2023-03-03 DIAGNOSIS — E782 Mixed hyperlipidemia: Secondary | ICD-10-CM | POA: Diagnosis not present

## 2023-03-03 DIAGNOSIS — D649 Anemia, unspecified: Secondary | ICD-10-CM | POA: Diagnosis not present

## 2023-03-03 DIAGNOSIS — Z0000 Encounter for general adult medical examination without abnormal findings: Secondary | ICD-10-CM | POA: Diagnosis not present

## 2023-03-03 DIAGNOSIS — Z9181 History of falling: Secondary | ICD-10-CM | POA: Diagnosis not present

## 2023-05-05 DIAGNOSIS — E113211 Type 2 diabetes mellitus with mild nonproliferative diabetic retinopathy with macular edema, right eye: Secondary | ICD-10-CM | POA: Diagnosis not present

## 2023-07-01 DIAGNOSIS — Z6824 Body mass index (BMI) 24.0-24.9, adult: Secondary | ICD-10-CM | POA: Diagnosis not present

## 2023-07-01 DIAGNOSIS — Z2821 Immunization not carried out because of patient refusal: Secondary | ICD-10-CM | POA: Diagnosis not present

## 2023-07-01 DIAGNOSIS — E782 Mixed hyperlipidemia: Secondary | ICD-10-CM | POA: Diagnosis not present

## 2023-07-01 DIAGNOSIS — E113211 Type 2 diabetes mellitus with mild nonproliferative diabetic retinopathy with macular edema, right eye: Secondary | ICD-10-CM | POA: Diagnosis not present

## 2023-09-15 DIAGNOSIS — E113211 Type 2 diabetes mellitus with mild nonproliferative diabetic retinopathy with macular edema, right eye: Secondary | ICD-10-CM | POA: Diagnosis not present

## 2023-09-29 DIAGNOSIS — E782 Mixed hyperlipidemia: Secondary | ICD-10-CM | POA: Diagnosis not present

## 2023-09-29 DIAGNOSIS — E113211 Type 2 diabetes mellitus with mild nonproliferative diabetic retinopathy with macular edema, right eye: Secondary | ICD-10-CM | POA: Diagnosis not present

## 2023-09-29 DIAGNOSIS — Z6824 Body mass index (BMI) 24.0-24.9, adult: Secondary | ICD-10-CM | POA: Diagnosis not present

## 2023-11-24 DIAGNOSIS — E113211 Type 2 diabetes mellitus with mild nonproliferative diabetic retinopathy with macular edema, right eye: Secondary | ICD-10-CM | POA: Diagnosis not present

## 2024-01-30 DIAGNOSIS — E113211 Type 2 diabetes mellitus with mild nonproliferative diabetic retinopathy with macular edema, right eye: Secondary | ICD-10-CM | POA: Diagnosis not present

## 2024-01-30 DIAGNOSIS — Z6824 Body mass index (BMI) 24.0-24.9, adult: Secondary | ICD-10-CM | POA: Diagnosis not present

## 2024-01-30 DIAGNOSIS — D649 Anemia, unspecified: Secondary | ICD-10-CM | POA: Diagnosis not present

## 2024-01-30 DIAGNOSIS — E782 Mixed hyperlipidemia: Secondary | ICD-10-CM | POA: Diagnosis not present

## 2024-02-02 DIAGNOSIS — E113211 Type 2 diabetes mellitus with mild nonproliferative diabetic retinopathy with macular edema, right eye: Secondary | ICD-10-CM | POA: Diagnosis not present

## 2024-02-07 DIAGNOSIS — E875 Hyperkalemia: Secondary | ICD-10-CM | POA: Diagnosis not present

## 2024-04-09 DIAGNOSIS — T63421A Toxic effect of venom of ants, accidental (unintentional), initial encounter: Secondary | ICD-10-CM | POA: Diagnosis not present

## 2024-04-09 DIAGNOSIS — Z6824 Body mass index (BMI) 24.0-24.9, adult: Secondary | ICD-10-CM | POA: Diagnosis not present

## 2024-04-09 DIAGNOSIS — Z2821 Immunization not carried out because of patient refusal: Secondary | ICD-10-CM | POA: Diagnosis not present

## 2024-04-09 DIAGNOSIS — L03116 Cellulitis of left lower limb: Secondary | ICD-10-CM | POA: Diagnosis not present

## 2024-04-09 DIAGNOSIS — L03115 Cellulitis of right lower limb: Secondary | ICD-10-CM | POA: Diagnosis not present

## 2024-04-12 DIAGNOSIS — E113211 Type 2 diabetes mellitus with mild nonproliferative diabetic retinopathy with macular edema, right eye: Secondary | ICD-10-CM | POA: Diagnosis not present
